# Patient Record
Sex: Male | Born: 1965 | Race: White | Hispanic: No
Health system: Southern US, Community
[De-identification: ages and names within clinical notes are randomized; demographics above are authoritative.]

## PROBLEM LIST (undated history)

## (undated) DIAGNOSIS — I10 Essential (primary) hypertension: Secondary | ICD-10-CM

## (undated) HISTORY — PX: CARPAL TUNNEL RELEASE: SHX101

---

## 2017-03-21 DIAGNOSIS — I10 Essential (primary) hypertension: Secondary | ICD-10-CM | POA: Insufficient documentation

## 2017-06-13 DIAGNOSIS — Z87442 Personal history of urinary calculi: Secondary | ICD-10-CM | POA: Insufficient documentation

## 2019-07-30 ENCOUNTER — Other Ambulatory Visit: Payer: Self-pay

## 2019-07-30 ENCOUNTER — Ambulatory Visit (INDEPENDENT_AMBULATORY_CARE_PROVIDER_SITE_OTHER): Payer: BC Managed Care – PPO | Admitting: Podiatry

## 2019-07-30 ENCOUNTER — Ambulatory Visit (INDEPENDENT_AMBULATORY_CARE_PROVIDER_SITE_OTHER): Payer: BC Managed Care – PPO

## 2019-07-30 DIAGNOSIS — M722 Plantar fascial fibromatosis: Secondary | ICD-10-CM | POA: Diagnosis not present

## 2019-08-03 MED ORDER — MELOXICAM 15 MG PO TABS
15.0000 mg | ORAL_TABLET | Freq: Every day | ORAL | 1 refills | Status: DC
Start: 2019-08-03 — End: 2019-09-30

## 2019-08-03 NOTE — Progress Notes (Signed)
   Subjective: 54 y.o. male presenting for right heel pain. 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  Pain is been ongoing for 1-2 years now.  Sudden onset but he denies history of injury.  He continually gets burning and stinging especially with the first up in the morning getting out of bed.  He has tried stretching exercises, cortisone shots, with minimal relief.  He presents for further treatment and evaluation   No past medical history on file.   Objective: Physical Exam General: The patient is alert and oriented x3 in no acute distress.  Dermatology: Skin is warm, dry and supple bilateral lower extremities. Negative for open lesions or macerations bilateral.   Vascular: Dorsalis Pedis and Posterior Tibial pulses palpable bilateral.  Capillary fill time is immediate to all digits.  Neurological: Epicritic and protective threshold intact bilateral.   Musculoskeletal: Tenderness to palpation to the plantar aspect of the right heel along the plantar fascia. All other joints range of motion within normal limits bilateral. Strength 5/5 in all groups bilateral.   Radiographic exam: Normal osseous mineralization. Joint spaces preserved. No fracture/dislocation/boney destruction. No other soft tissue abnormalities or radiopaque foreign bodies.   Assessment: 1. Plantar fasciitis right  Plan of Care:  1. Patient evaluated. Xrays reviewed.   2. Injection of 0.5cc Celestone soluspan injected into the right plantar fascia  3.  Appointment with Pedorthist for custom molded orthotics 4.  Prescription for meloxicam 15 mg daily 5.  Continue OTC insoles in the meantime until he can get the custom orthotics 6.  Return to clinic as needed  *[employer]   Felecia Shelling, DPM [facility]  Dr. Felecia Shelling, DPM    [address] [place], [place] [address]                Office [phone]  Fax [phone]

## 2019-08-18 ENCOUNTER — Other Ambulatory Visit: Payer: BC Managed Care – PPO | Admitting: Orthotics

## 2019-09-01 ENCOUNTER — Other Ambulatory Visit: Payer: Self-pay

## 2019-09-01 ENCOUNTER — Ambulatory Visit (INDEPENDENT_AMBULATORY_CARE_PROVIDER_SITE_OTHER): Payer: BC Managed Care – PPO | Admitting: Orthotics

## 2019-09-01 DIAGNOSIS — M722 Plantar fascial fibromatosis: Secondary | ICD-10-CM | POA: Diagnosis not present

## 2019-09-01 NOTE — Progress Notes (Signed)

## 2019-09-29 ENCOUNTER — Ambulatory Visit (INDEPENDENT_AMBULATORY_CARE_PROVIDER_SITE_OTHER): Payer: BC Managed Care – PPO | Admitting: Orthotics

## 2019-09-29 ENCOUNTER — Other Ambulatory Visit: Payer: Self-pay | Admitting: Podiatry

## 2019-09-29 ENCOUNTER — Other Ambulatory Visit: Payer: Self-pay

## 2019-09-29 DIAGNOSIS — M722 Plantar fascial fibromatosis: Secondary | ICD-10-CM

## 2019-09-29 NOTE — Progress Notes (Signed)
Patient came in today to pick up custom made foot orthotics.  The goals were accomplished and the patient reported no dissatisfaction with said orthotics.  Patient was advised of breakin period and how to report any issues. 

## 2019-11-25 ENCOUNTER — Other Ambulatory Visit: Payer: Self-pay | Admitting: Podiatry

## 2019-11-25 NOTE — Telephone Encounter (Signed)
Please advise 

## 2020-10-12 ENCOUNTER — Encounter: Payer: Self-pay | Admitting: Emergency Medicine

## 2020-10-12 ENCOUNTER — Emergency Department: Payer: No Typology Code available for payment source

## 2020-10-12 ENCOUNTER — Other Ambulatory Visit: Payer: Self-pay

## 2020-10-12 ENCOUNTER — Emergency Department
Admission: EM | Admit: 2020-10-12 | Discharge: 2020-10-12 | Disposition: A | Discharge status: Home / Self Care | Payer: No Typology Code available for payment source | Attending: Emergency Medicine | Admitting: Emergency Medicine

## 2020-10-12 DIAGNOSIS — Y990 Civilian activity done for income or pay: Secondary | ICD-10-CM | POA: Diagnosis not present

## 2020-10-12 DIAGNOSIS — W208XXA Other cause of strike by thrown, projected or falling object, initial encounter: Secondary | ICD-10-CM | POA: Diagnosis not present

## 2020-10-12 DIAGNOSIS — S0101XA Laceration without foreign body of scalp, initial encounter: Secondary | ICD-10-CM | POA: Diagnosis not present

## 2020-10-12 DIAGNOSIS — I10 Essential (primary) hypertension: Secondary | ICD-10-CM | POA: Diagnosis not present

## 2020-10-12 DIAGNOSIS — Z23 Encounter for immunization: Secondary | ICD-10-CM | POA: Insufficient documentation

## 2020-10-12 DIAGNOSIS — S0990XA Unspecified injury of head, initial encounter: Secondary | ICD-10-CM

## 2020-10-12 HISTORY — DX: Essential (primary) hypertension: I10

## 2020-10-12 MED ORDER — TETANUS-DIPHTH-ACELL PERTUSSIS 5-2.5-18.5 LF-MCG/0.5 IM SUSY
0.5000 mL | PREFILLED_SYRINGE | Freq: Once | INTRAMUSCULAR | Status: AC
  Administered 2020-10-12: 0.5 mL via INTRAMUSCULAR
  Filled 2020-10-12: qty 0.5

## 2020-10-12 MED ORDER — LIDOCAINE-EPINEPHRINE-TETRACAINE (LET) TOPICAL GEL
3.0000 mL | Freq: Once | TOPICAL | Status: AC
  Administered 2020-10-12: 3 mL via TOPICAL
  Filled 2020-10-12: qty 3

## 2020-10-12 MED ORDER — ACETAMINOPHEN 325 MG PO TABS
650.0000 mg | ORAL_TABLET | Freq: Once | ORAL | Status: AC
  Administered 2020-10-12: 650 mg via ORAL
  Filled 2020-10-12: qty 2

## 2020-10-12 NOTE — ED Notes (Signed)
Patient spoke with Print production planner, Avel Peace; states UDS is required for [employer].  EDT, Felicia notified.

## 2020-10-12 NOTE — ED Provider Notes (Signed)
[facility] Emergency Department Provider Note  ____________________________________________   Event Date/Time   First MD Initiated Contact with Patient 10/12/20 1056     (approximate)  I have reviewed the triage vital signs and the nursing notes.   HISTORY  Chief Complaint Head Injury    HPI Damon Cobb is a 55 y.o. male presents emergency department with head injury.  Patient was pounding in a pole when the pounder flipped back and hit him on top of his head.  Weighs over 30 pounds.  No LOC.  States he does have a headache.  Does not take any blood thinners.   unsure of his last  Past Medical History:  Diagnosis Date   Hypertension     There are no problems to display for this patient.   Past Surgical History:  Procedure Laterality Date   CARPAL TUNNEL RELEASE      Prior to Admission medications   Medication Sig Start Date End Date Taking? Authorizing Provider  meloxicam (MOBIC) 15 MG tablet TAKE 1 TABLET(15 MG) BY MOUTH DAILY 11/26/19   Felecia Shelling, DPM    Allergies Patient has no known allergies.  No family history on file.  Social History    Review of Systems  Constitutional: No fever/chills Eyes: No visual changes. ENT: No sore throat. Respiratory: Denies cough Cardiovascular: Denies chest pain Gastrointestinal: Denies abdominal pain Genitourinary: Negative for dysuria. Musculoskeletal: Negative for back pain. Skin: Negative for rash. Psychiatric: no mood changes,     ____________________________________________   PHYSICAL EXAM:  VITAL SIGNS: ED Triage Vitals  Enc Vitals Group     BP 10/12/20 1012 (!) 148/98     Pulse Rate 10/12/20 1012 77     Resp 10/12/20 1012 18     Temp 10/12/20 1012 98 F (36.7 C)     Temp Source 10/12/20 1012 Oral     SpO2 10/12/20 1012 97 %     Weight 10/12/20 1002 261 lb (118.4 kg)     Height 10/12/20 1002 6\' 1"  (1.854 m)     Head Circumference --      Peak Flow --      Pain  Score 10/12/20 1002 8     Pain Loc --      Pain Edu? --      Excl. in GC? --     Constitutional: Alert and oriented. Well appearing and in no acute distress. Eyes: Conjunctivae are normal.  Head: Laceration noted to the top of the head proximately 4 to 5 inches Nose: No congestion/rhinnorhea. Mouth/Throat: Mucous membranes are moist.   Neck:  supple no lymphadenopathy noted Cardiovascular: Normal rate, regular rhythm.  Respiratory: Normal respiratory effort.  No retractions, GU: deferred Musculoskeletal: FROM all extremities, warm and well perfused Neurologic:  Normal speech and language.  Skin:  Skin is warm, dry No rash noted. Psychiatric: Mood and affect are normal. Speech and behavior are normal.  ____________________________________________   LABS (all labs ordered are listed, but only abnormal results are displayed)  Labs Reviewed - No data to display ____________________________________________   ____________________________________________  RADIOLOGY  CT of the head  ____________________________________________   PROCEDURES  Procedure(s) performed:   10/01/22Marland KitchenLaceration Repair  Date/Time: 10/12/2020 12:59 PM Performed by: 10/14/2020, PA-C Authorized by: Faythe Ghee, PA-C   Consent:    Consent obtained:  Verbal   Consent given by:  Patient   Risks discussed:  Infection, pain, poor cosmetic result, poor wound healing, nerve damage and need  for additional repair Universal protocol:    Procedure explained and questions answered to patient or proxy's satisfaction: yes     Patient identity confirmed:  Verbally with patient Anesthesia:    Anesthesia method:  Topical application   Topical anesthetic:  LET Laceration details:    Location:  Scalp   Length (cm):  4 Pre-procedure details:    Preparation:  Patient was prepped and draped in usual sterile fashion Exploration:    Limited defect created (wound extended): no     Hemostasis achieved with:  LET    Imaging outcome: foreign body not noted     Wound exploration: wound explored through full range of motion     Wound extent: no foreign bodies/material noted, no muscle damage noted, no nerve damage noted, no tendon damage noted, no underlying fracture noted and no vascular damage noted     Contaminated: no   Treatment:    Area cleansed with:  Saline   Amount of cleaning:  Standard   Irrigation solution:  Sterile saline   Irrigation method:  Tap Skin repair:    Repair method:  Staples   Number of staples:  4 Approximation:    Approximation:  Close Repair type:    Repair type:  Simple Post-procedure details:    Dressing:  Non-adherent dressing   Procedure completion:  Tolerated well, no immediate complications    ____________________________________________   INITIAL IMPRESSION / ASSESSMENT AND PLAN / ED COURSE  Pertinent labs & imaging results that were available during my care of the patient were reviewed by me and considered in my medical decision making (see chart for details).   Patient is a 55 year old male presents after head injury.  See HPI.  Physical exam shows patient appears stable  CT of the head due to mechanism of injury Tdap due to laceration See procedure note for laceration repair  CT of the head reviewed by me confirmed by radiology be negative for any acute abnormality  Explained the findings to the patient.  He was given [employer]. instructions to return in 10 days or see urgent care for staple removal.  Tdap was updated here in the ED.  Discharged in stable condition.  Damon Cobb was evaluated in Emergency Department on 10/12/2020 for the symptoms described in the history of present illness. He was evaluated in the context of the global COVID-19 pandemic, which necessitated consideration that the patient might be at risk for infection with the SARS-CoV-2 virus that causes COVID-19. Institutional protocols and algorithms that pertain to the evaluation of  patients at risk for COVID-19 are in a state of rapid change based on information released by regulatory bodies including the CDC and federal and state organizations. These policies and algorithms were followed during the patient's care in the ED.    As part of my medical decision making, I reviewed the following data within the electronic MEDICAL RECORD NUMBER Nursing notes reviewed and incorporated, Old chart reviewed, Radiograph reviewed , Notes from prior ED visits, and [place] Controlled Substance Database  ____________________________________________   FINAL CLINICAL IMPRESSION(S) / ED DIAGNOSES  Final diagnoses:  Injury of head, initial encounter  Laceration of scalp, initial encounter      NEW MEDICATIONS STARTED DURING THIS VISIT:  New Prescriptions   No medications on file     Note:  This document was prepared using Dragon voice recognition software and may include unintentional dictation errors.    Faythe Ghee, PA-C 10/12/20 1301    Sharman Cheek,  MD 10/12/20 1452

## 2020-10-12 NOTE — ED Triage Notes (Addendum)
Hit top of head with metal piece while at work.  Approximate 3 in laceration to top of head.  Bleeding controlled.  DBD applied.  Denies LOC  No WC profile found.

## 2020-10-12 NOTE — ED Notes (Signed)
This tech informed pt that we were unable to find WC in our profile. Pt is aware to call his manager to ask about WC.

## 2020-10-12 NOTE — Discharge Instructions (Signed)
Follow-up with your regular doctor as needed. Follow-up with an urgent care or return emergency department for staple removal in 10 days Keep the areas clean and dry as possible.  You may wash it gently with soap and water when in the shower Return if any sign of infection or if worsening

## 2020-10-12 NOTE — ED Notes (Signed)
Walked urine drug screen to lab at 11:38

## 2020-10-12 NOTE — ED Notes (Signed)
Non-stick dressing applied to head.

## 2020-11-27 ENCOUNTER — Other Ambulatory Visit: Payer: Self-pay

## 2020-11-27 DIAGNOSIS — Z1211 Encounter for screening for malignant neoplasm of colon: Secondary | ICD-10-CM

## 2020-11-27 MED ORDER — PEG 3350-KCL-NA BICARB-NACL 420 G PO SOLR: 4000.0000 mL | Freq: Once | ORAL | 0 refills | Status: AC

## 2020-11-27 NOTE — Progress Notes (Signed)
Gastroenterology Pre-Procedure Review  Request Date: 12/05/20 Requesting Physician: Dr. Servando Snare  PATIENT REVIEW QUESTIONS: The patient responded to the following health history questions as indicated:    1. Are you having any GI issues? no 2. Do you have a personal history of Polyps? yes (unsure if the year. One small polyps removed.) 3. Do you have a family history of Colon Cancer or Polyps? no 4. Diabetes Mellitus? no 5. Joint replacements in the past 12 months?no 6. Major health problems in the past 3 months?no 7. Any artificial heart valves, MVP, or defibrillator?no    MEDICATIONS & ALLERGIES:    Patient reports the following regarding taking any anticoagulation/antiplatelet therapy:   Plavix, Coumadin, Eliquis, Xarelto, Lovenox, Pradaxa, Brilinta, or Effient? no Aspirin? no  Patient confirms/reports the following medications:  Current Outpatient Medications  Medication Sig Dispense Refill   sildenafil (VIAGRA) 100 MG tablet Take by mouth as needed.     amLODipine (NORVASC) 10 MG tablet Take 10 mg by mouth daily.     meloxicam (MOBIC) 15 MG tablet TAKE 1 TABLET(15 MG) BY MOUTH DAILY 30 tablet 1   No current facility-administered medications for this visit.    Patient confirms/reports the following allergies:  No Known Allergies  No orders of the defined types were placed in this encounter.   AUTHORIZATION INFORMATION Primary Insurance: 1D#: Group #:  Secondary Insurance: 1D#: Group #:  SCHEDULE INFORMATION: Date: 12/05/20 Time: Location: [facility]

## 2020-12-01 ENCOUNTER — Telehealth: Payer: Self-pay | Admitting: Gastroenterology

## 2020-12-01 NOTE — Telephone Encounter (Signed)
Procedure has been cancelled per Endo unit.

## 2020-12-01 NOTE — Telephone Encounter (Signed)
Inbound call from pt stating that he needs to cxl his procedure and will call back at the 1st of the year to r/s

## 2020-12-05 ENCOUNTER — Ambulatory Visit
Admission: RE | Admit: 2020-12-05 | Payer: BC Managed Care – PPO | Source: Home / Self Care | Admitting: Gastroenterology

## 2020-12-05 ENCOUNTER — Encounter: Admission: RE | Payer: Self-pay | Source: Home / Self Care

## 2020-12-05 SURGERY — COLONOSCOPY WITH PROPOFOL
Anesthesia: General

## 2020-12-28 ENCOUNTER — Other Ambulatory Visit: Payer: Self-pay

## 2021-03-20 ENCOUNTER — Telehealth: Payer: Self-pay | Admitting: Gastroenterology

## 2021-03-20 NOTE — Telephone Encounter (Signed)
Pt left message wanting to sched colonoscopy ref is in the system. ?

## 2021-03-21 ENCOUNTER — Telehealth: Payer: Self-pay

## 2021-03-21 NOTE — Telephone Encounter (Signed)
Called patient back to reschedule procedure patient didn't answer left voicemail for call back  ?

## 2022-11-13 IMAGING — CT CT HEAD W/O CM
3 series · 15 of 47 positions shown, 18 images · non-contrast
Comparison: None.

CLINICAL DATA: 55-year-old male struck on the top of the head by
metal piece at work. Laceration, bleeding controlled.

EXAM:
CT HEAD WITHOUT CONTRAST
TECHNIQUE: Contiguous axial images were obtained from the base of the skull
through the vertex without intravenous contrast.

[Series 2: head wo · axial · 0.43mm/px · z∈[-107,+28]mm · 9 of 33 slices shown, 12 images]
[im 3/33  brain]
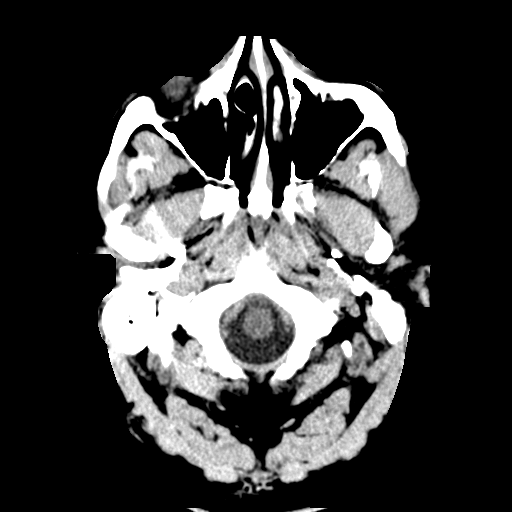
[im 3/33  bone]
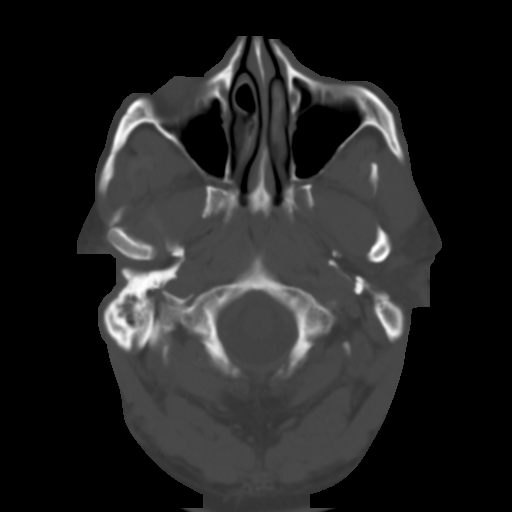
[im 6/33  brain]
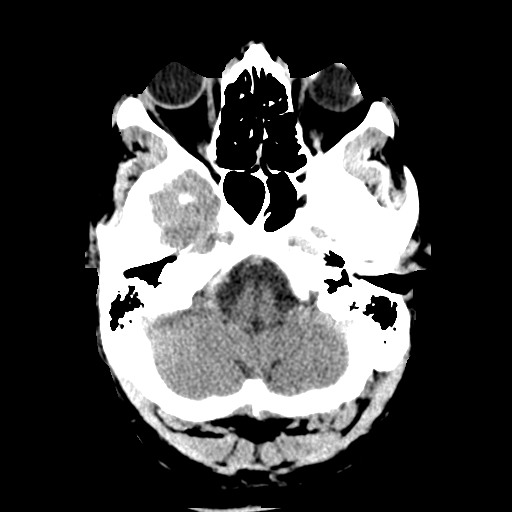
[im 9/33  brain]
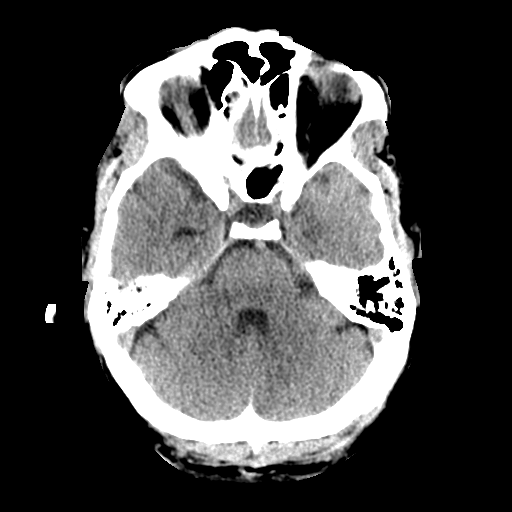
[im 13/33  brain]
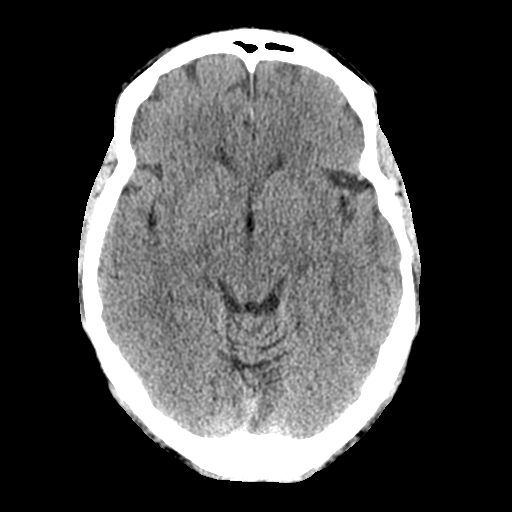
[im 17/33  brain]
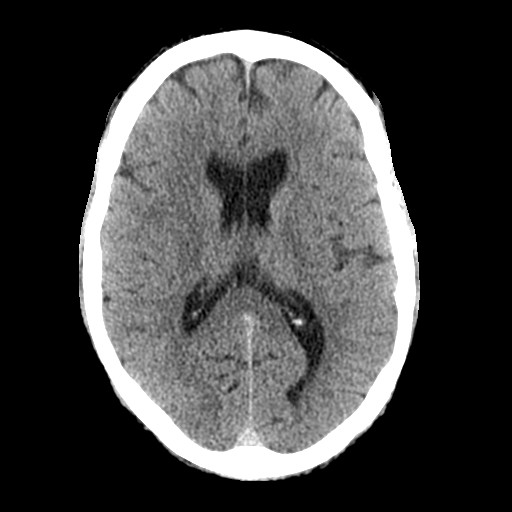
[im 17/33  bone]
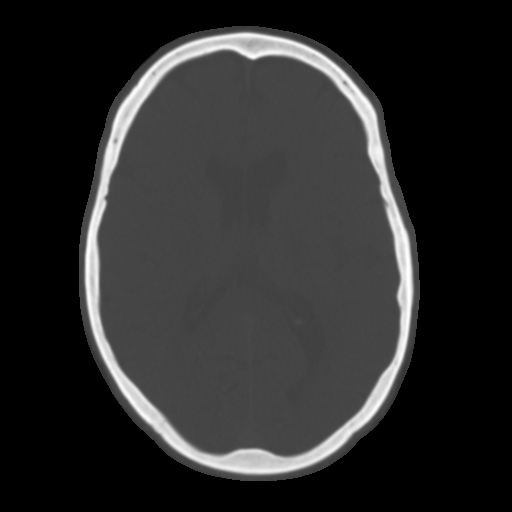
[im 20/33  brain]
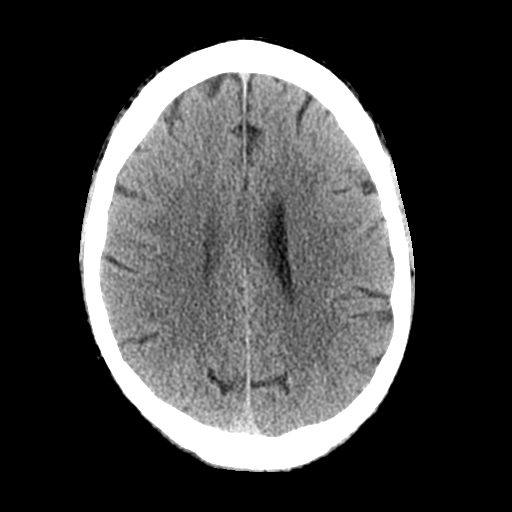
[im 24/33  brain]
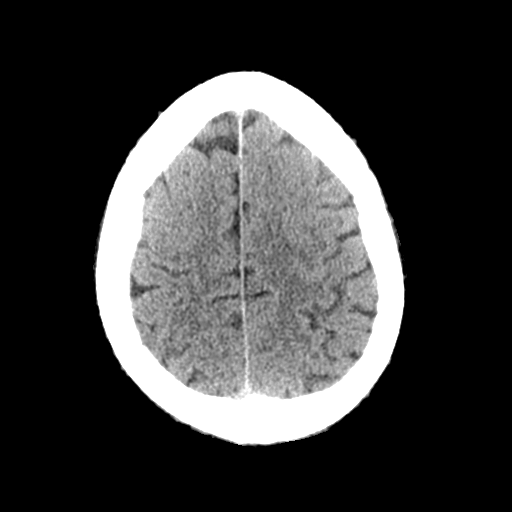
[im 27/33  brain]
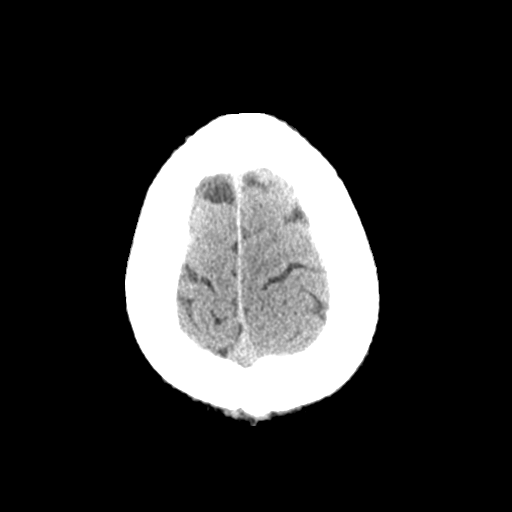
[im 30/33  brain]
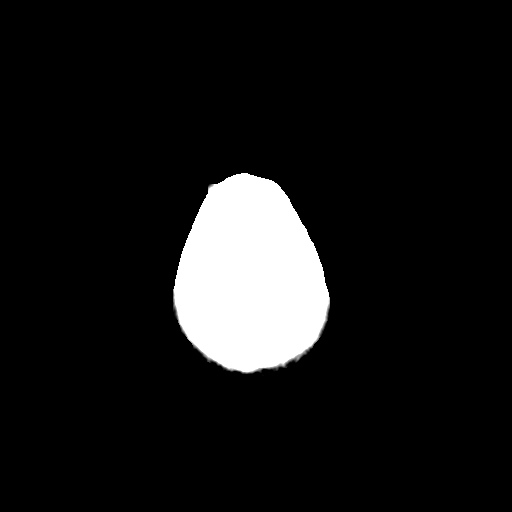
[im 30/33  bone]
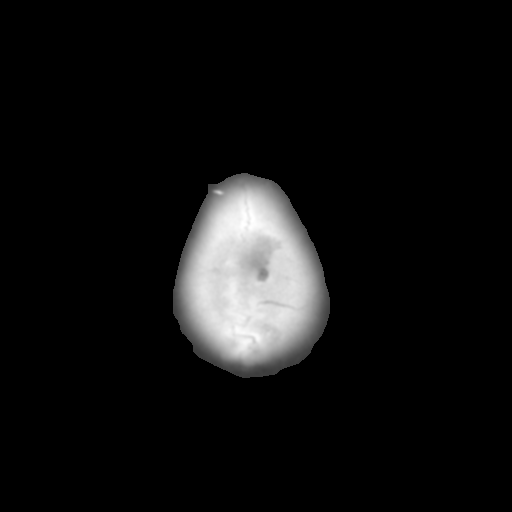

[Series 4: coronal soft tissue · coronal · 0.31mm/px · 3 of 73 slices shown]
[im 25/73  brain]
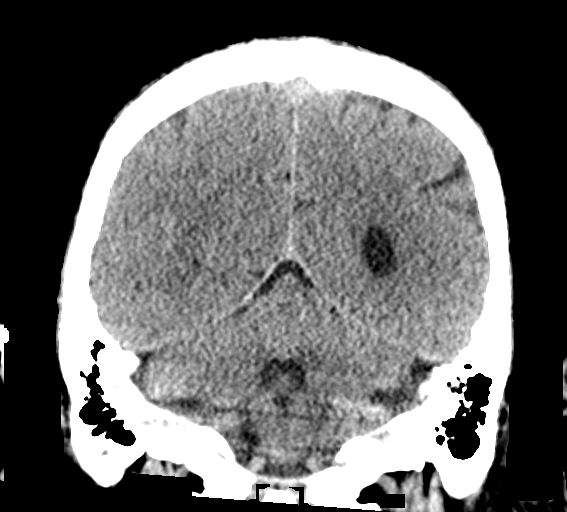
[im 33/73  brain]
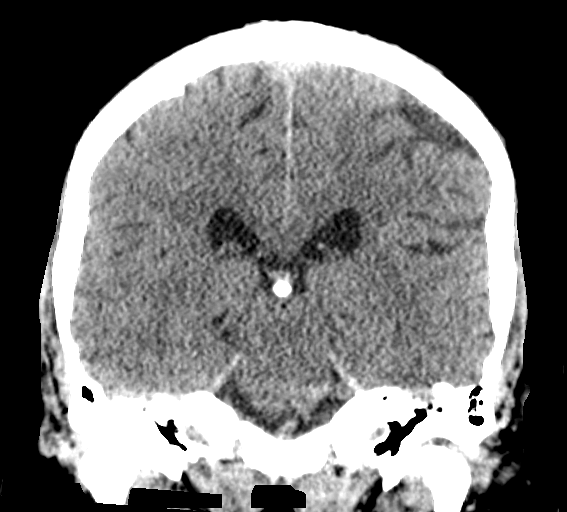
[im 41/73  brain]
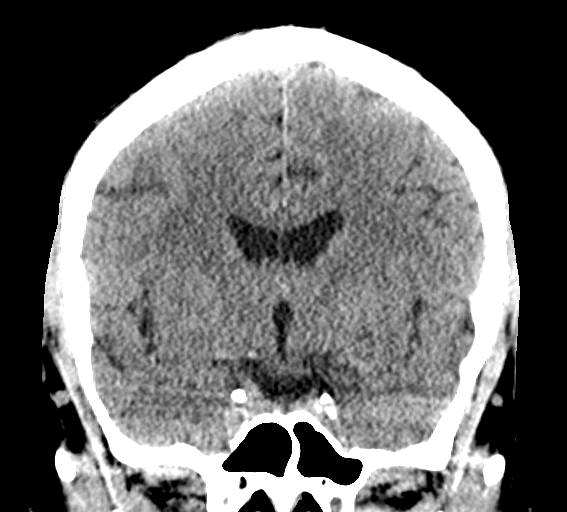

[Series 5: sagittal soft tissue · sagittal · 0.33mm/px · 3 of 61 slices shown]
[im 21/61  brain]
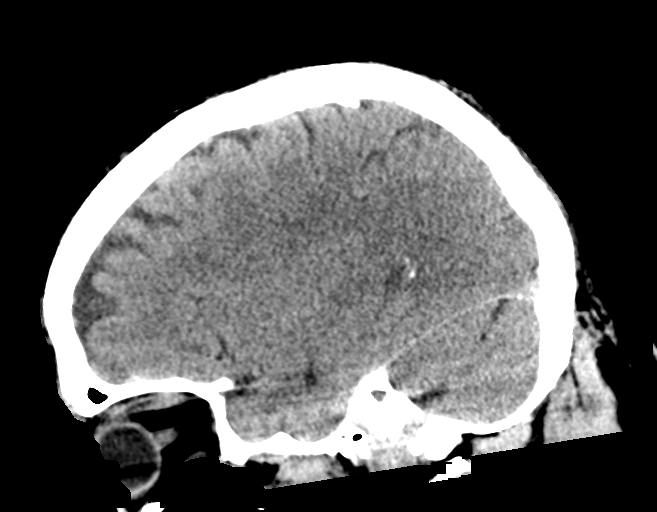
[im 31/61  brain]
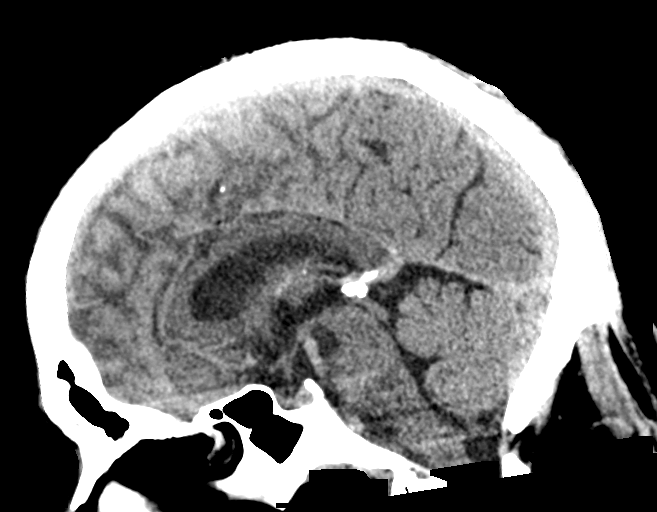
[im 41/61  brain]
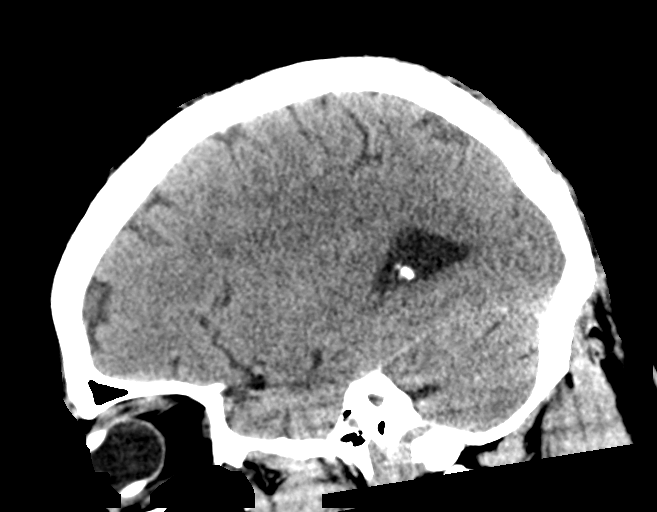

[15 of 47 positions shown; findings below may reference images not displayed]

FINDINGS: Brain: Cerebral volume is within normal limits for age. No midline
shift, ventriculomegaly, mass effect, evidence of mass lesion,
intracranial hemorrhage or evidence of cortically based acute
infarction. Mild for age white matter hypodensity in the anterior
left frontal lobe near the frontal horn. Elsewhere gray-white matter
differentiation appears normal.

Vascular: Mild Calcified atherosclerosis at the skull base. No
suspicious intracranial vascular hyperdensity.

Skull: Intact, negative.

Sinuses/Orbits: Visualized paranasal sinuses and mastoids are clear.

Other: Vertex scalp skin staples. No underlying scalp hematoma or
gas. Underlying calvarium appears intact.

Chronic postoperative changes to the left globe. No other orbit or
scalp soft tissue injury.
IMPRESSION: 1. Vertex scalp skin staples without underlying skull fracture.
2. No acute intracranial abnormality. Mild for age nonspecific white
matter changes in the left frontal lobe.
